# Patient Record
Sex: Male | Born: 2000 | Race: White | Hispanic: No | Marital: Single | Smoking: Never smoker
Health system: Southern US, Community
[De-identification: ages and names within clinical notes are randomized; demographics above are authoritative.]

## PROBLEM LIST (undated history)

## (undated) DIAGNOSIS — S329XXA Fracture of unspecified parts of lumbosacral spine and pelvis, initial encounter for closed fracture: Secondary | ICD-10-CM

## (undated) HISTORY — PX: TONSILLECTOMY AND ADENOIDECTOMY: SUR1326

---
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
## 2019-10-19 IMAGING — MR MR ELBOW*R* W/O CM
5 series · 40 of 40 positions shown · non-contrast
Comparison: None.

CLINICAL DATA: Baseball injury 1 month ago.  Persistent elbow pain.

EXAM:
MRI OF THE RIGHT ELBOW WITHOUT CONTRAST
TECHNIQUE: Multiplanar, multisequence MR imaging of the elbow was performed. No
intravenous contrast was administered.

[Series 4: T1 · axial · 4.0mm · 0.62mm/px · z∈[-62,+63]mm · 7 of 25 slices shown]
[im 1/25]
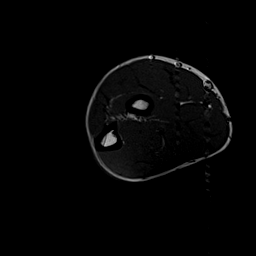
[im 5/25]
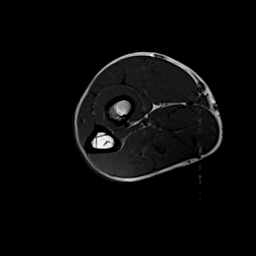
[im 9/25]
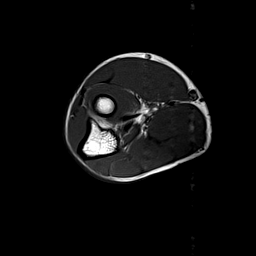
[im 13/25]
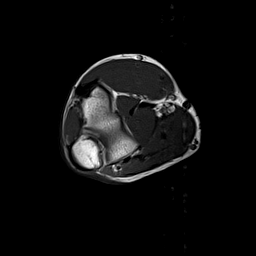
[im 17/25]
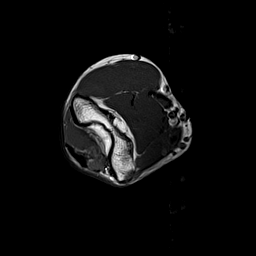
[im 21/25]
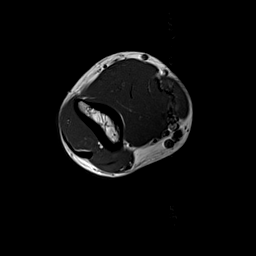
[im 25/25]
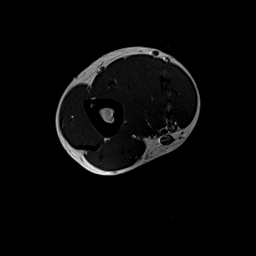

[Series 5: T2 fat-sat · axial · 4.0mm · 0.62mm/px · z∈[-62,+63]mm · 8 of 25 slices shown (1 of 2)]
[im 1/25]
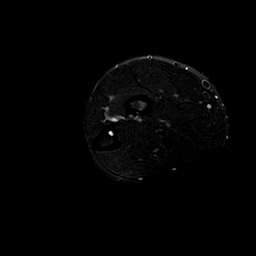
[im 4/25]
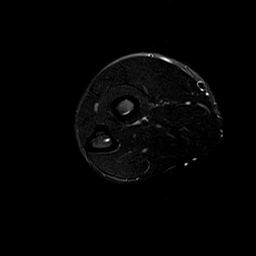
[im 7/25]
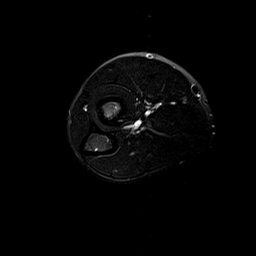
[im 11/25]
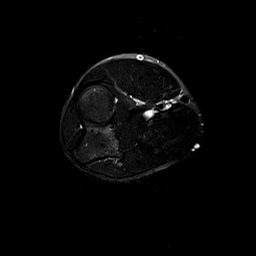
[im 14/25]
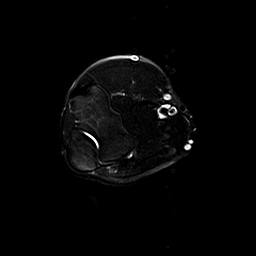
[im 18/25]
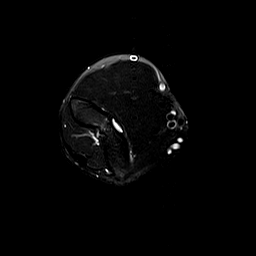
[im 21/25]
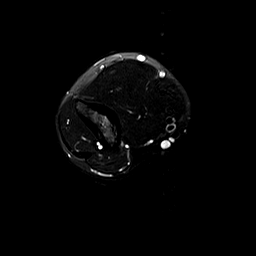
[im 25/25]
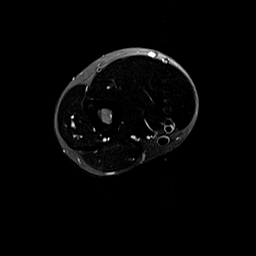

[Series 6: T2 fat-sat · sagittal · 4.0mm · 0.70mm/px · 8 of 24 slices shown (2 of 2)]
[im 1/24]
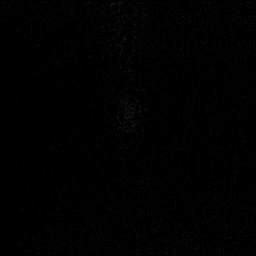
[im 4/24]
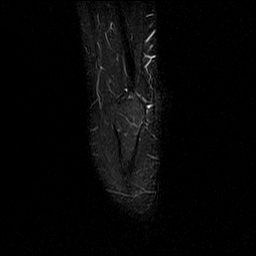
[im 7/24]
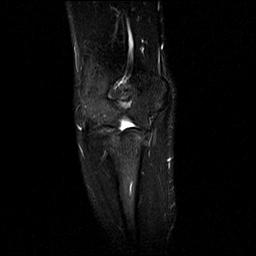
[im 10/24]
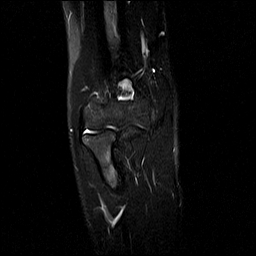
[im 14/24]
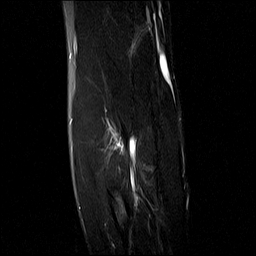
[im 17/24]
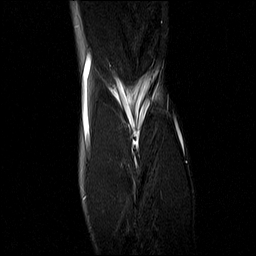
[im 20/24]
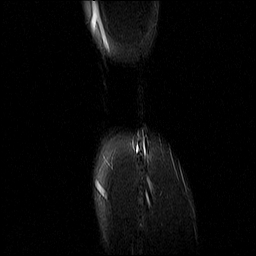
[im 24/24]
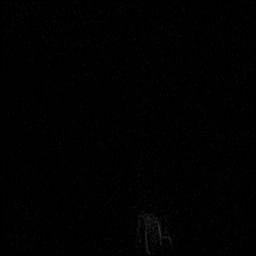

[Series 7: PD fat-sat · coronal · 3.0mm · 0.70mm/px · 9 of 28 slices shown]
[im 1/28]
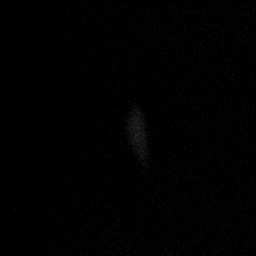
[im 4/28]
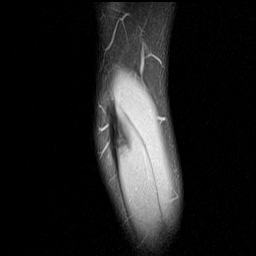
[im 7/28]
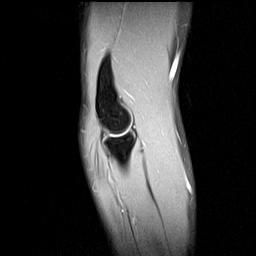
[im 11/28]
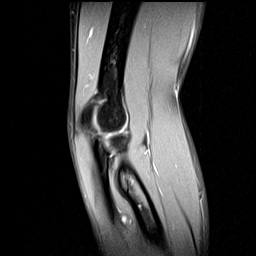
[im 14/28]
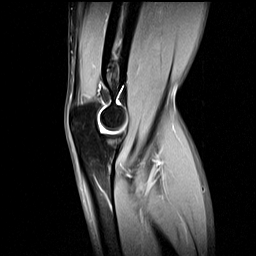
[im 17/28]
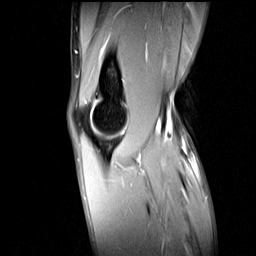
[im 21/28]
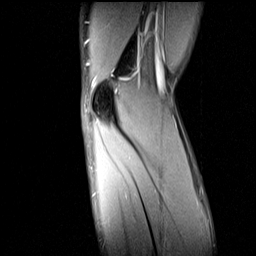
[im 24/28]
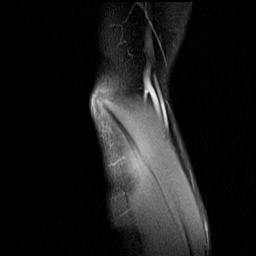
[im 28/28]
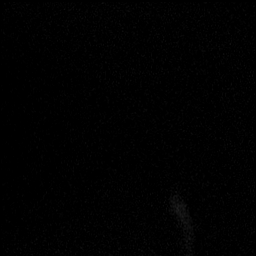

[Series 8: STIR · sagittal · 4.0mm · 0.35mm/px · 8 of 26 slices shown]
[im 1/26]
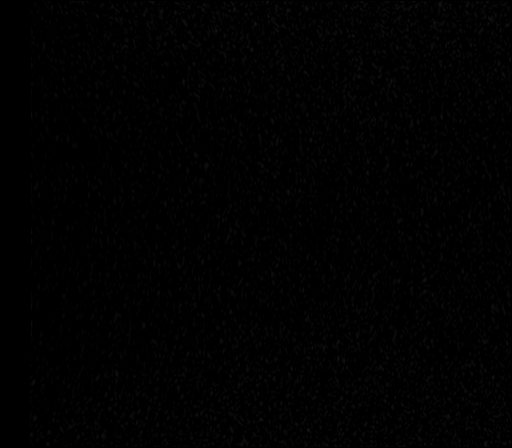
[im 4/26]
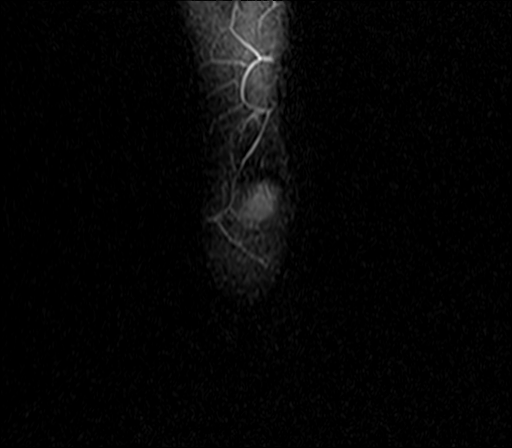
[im 8/26]
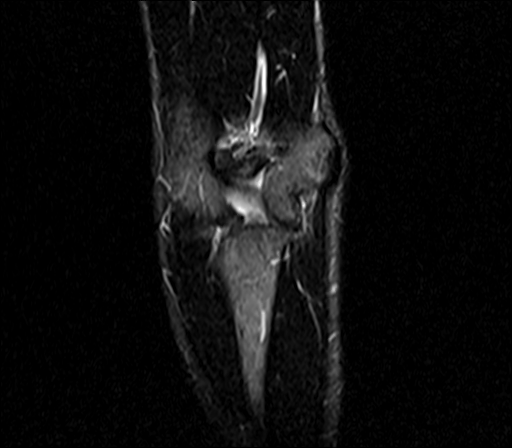
[im 11/26]
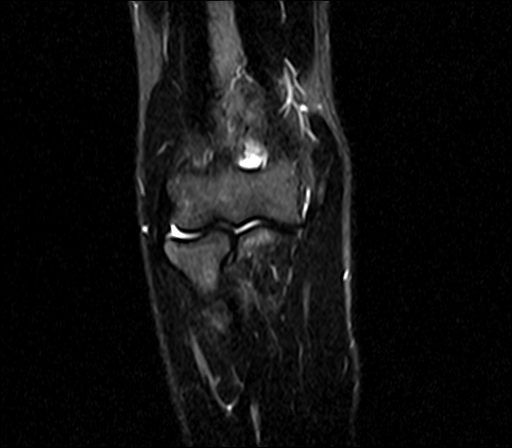
[im 15/26]
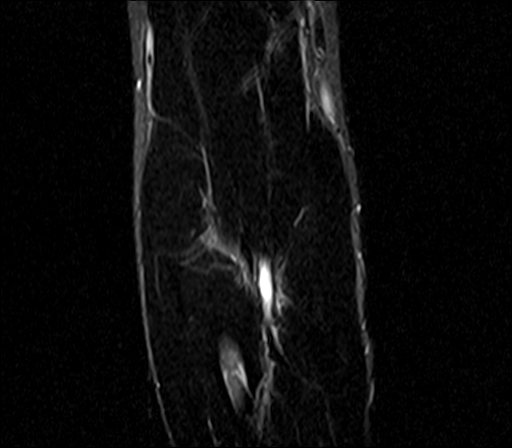
[im 18/26]
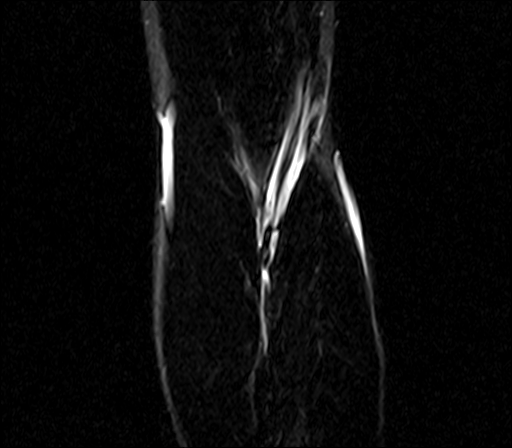
[im 22/26]
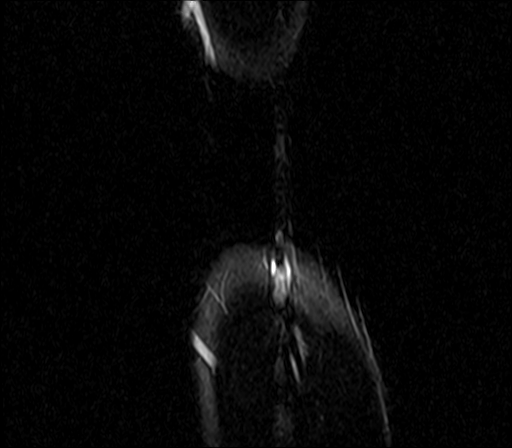
[im 26/26]
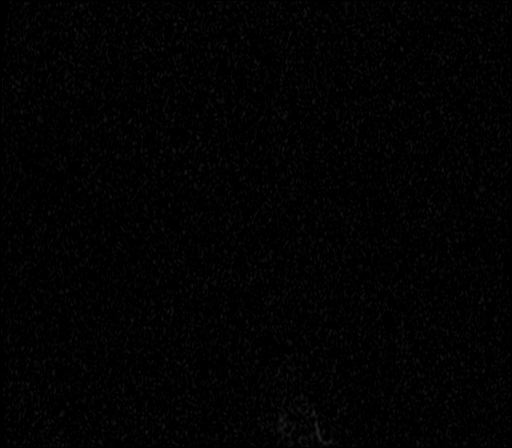

[40 of 40 positions shown; findings below may reference images not displayed]

FINDINGS: TENDONS

Common forearm flexor origin: Normal

Common forearm extensor origin: Normal

Biceps: Intact.  The brachialis tendon is also intact.

Triceps: Intact

LIGAMENTS

Medial stabilizers: Intact

Lateral stabilizers:  Intact

Cartilage: Normal articular cartilage. No cartilage defects or
osteochondral lesion.

Joint: No joint effusion or loose bodies.

Cubital tunnel: Normal

Bones: No acute bony findings.

Muscles:  Normal signal intensity.  No muscle tear or myositis.
IMPRESSION: Unremarkable MR examination of the right elbow. If symptoms persist
or worsen MR arthrography may be helpful for further evaluation.

## 2021-08-29 ENCOUNTER — Other Ambulatory Visit: Payer: Self-pay

## 2021-08-29 ENCOUNTER — Ambulatory Visit
Admission: EM | Admit: 2021-08-29 | Discharge: 2021-08-29 | Disposition: A | Discharge status: Home / Self Care | Payer: Managed Care, Other (non HMO) | Attending: Family Medicine | Admitting: Family Medicine

## 2021-08-29 ENCOUNTER — Encounter: Payer: Self-pay | Admitting: Emergency Medicine

## 2021-08-29 DIAGNOSIS — R1030 Lower abdominal pain, unspecified: Secondary | ICD-10-CM | POA: Diagnosis not present

## 2021-08-29 LAB — URINALYSIS, ROUTINE W REFLEX MICROSCOPIC
Glucose, UA: NEGATIVE mg/dL
Ketones, ur: 80 mg/dL — AB
Leukocytes,Ua: NEGATIVE
Nitrite: NEGATIVE
Protein, ur: 30 mg/dL — AB
Specific Gravity, Urine: 1.025 (ref 1.005–1.030)
pH: 5.5 (ref 5.0–8.0)

## 2021-08-29 LAB — COMPREHENSIVE METABOLIC PANEL
ALT: 20 U/L (ref 0–44)
AST: 23 U/L (ref 15–41)
Albumin: 4.6 g/dL (ref 3.5–5.0)
Alkaline Phosphatase: 51 U/L (ref 38–126)
Anion gap: 15 (ref 5–15)
BUN: 23 mg/dL — ABNORMAL HIGH (ref 6–20)
CO2: 21 mmol/L — ABNORMAL LOW (ref 22–32)
Calcium: 9.5 mg/dL (ref 8.9–10.3)
Chloride: 96 mmol/L — ABNORMAL LOW (ref 98–111)
Creatinine, Ser: 1.06 mg/dL (ref 0.61–1.24)
GFR, Estimated: >60 mL/min (ref >60)
Glucose, Bld: 91 mg/dL (ref 70–99)
Potassium: 3.6 mmol/L (ref 3.5–5.1)
Sodium: 132 mmol/L — ABNORMAL LOW (ref 135–145)
Total Bilirubin: 0.9 mg/dL (ref 0.3–1.2)
Total Protein: 8.2 g/dL — ABNORMAL HIGH (ref 6.5–8.1)

## 2021-08-29 LAB — URINALYSIS, MICROSCOPIC (REFLEX)

## 2021-08-29 LAB — CBC WITH DIFFERENTIAL/PLATELET
Abs Immature Granulocytes: 0.02 10*3/uL (ref 0.00–0.07)
Basophils Absolute: 0 10*3/uL (ref 0.0–0.1)
Basophils Relative: 0 %
Eosinophils Absolute: 0.1 10*3/uL (ref 0.0–0.5)
Eosinophils Relative: 1 %
HCT: 46 % (ref 39.0–52.0)
Hemoglobin: 16.6 g/dL (ref 13.0–17.0)
Immature Granulocytes: 0 %
Lymphocytes Relative: 22 %
Lymphs Abs: 1.4 10*3/uL (ref 0.7–4.0)
MCH: 30.7 pg (ref 26.0–34.0)
MCHC: 36.1 g/dL — ABNORMAL HIGH (ref 30.0–36.0)
MCV: 85.2 fL (ref 80.0–100.0)
Monocytes Absolute: 0.8 10*3/uL (ref 0.1–1.0)
Monocytes Relative: 12 %
Neutro Abs: 4.2 10*3/uL (ref 1.7–7.7)
Neutrophils Relative %: 65 %
Platelets: 225 10*3/uL (ref 150–400)
RBC: 5.4 MIL/uL (ref 4.22–5.81)
RDW: 12.7 % (ref 11.5–15.5)
WBC: 6.5 10*3/uL (ref 4.0–10.5)
nRBC: 0 % (ref 0.0–0.2)

## 2021-08-29 MED ORDER — ONDANSETRON HCL 4 MG PO TABS: 4.0000 mg | ORAL_TABLET | Freq: Four times a day (QID) | ORAL | 0 refills | Status: AC

## 2021-08-29 MED ORDER — AZITHROMYCIN 500 MG PO TABS: 500.0000 mg | ORAL_TABLET | Freq: Every day | ORAL | 0 refills | Status: AC

## 2021-08-29 NOTE — ED Provider Notes (Incomplete)
[facility]    CSN: [id number] Arrival date & time: 08/29/21  1201      History   Chief Complaint Chief Complaint  Patient presents with   Emesis   Diarrhea    HPI David Colon is a 21 y.o. male. Here today with abdominal pain, emesis, and diarrhea X4 days that is getting worse with a pretty extensive past GI history with no known diagnosis. Today he is here with new abdominal pain described as an ache-like, localized to the lower abdomen and getting worse; max pain 6-7/10 with palpation. He also endorses subjective fever, body aches, and chills. He is not tolerating food at this time, but did drink a little ginger ale prior to his last emesis. Of note, [place] the [place]. He denies any new foods or shellfish. His brother did have similar symptoms, but his symptoms have mostly resolved. Treatment includes imodium with no improvement.    Emesis Associated symptoms: abdominal pain, chills, diarrhea and fever   Diarrhea Associated symptoms: abdominal pain, chills, fever and vomiting    Past Medical History:  Diagnosis Date   Pelvic fracture [facility])     Patient Active Problem List   Diagnosis Date Noted   Pelvic fracture [facility])     Past Surgical History:  Procedure Laterality Date   TONSILLECTOMY AND ADENOIDECTOMY         Home Medications    Prior to Admission medications   Medication Sig Start Date End Date Taking? Authorizing Provider  azithromycin (ZITHROMAX) 500 MG tablet Take 1 tablet (500 mg total) by mouth daily for 3 days. 08/29/21 09/01/21 Yes FieldsGenia Del, MD  ondansetron (ZOFRAN) 4 MG tablet Take 1 tablet (4 mg total) by mouth every 6 (six) hours. 08/29/21  Yes Carrie Mew, MD  ondansetron (ZOFRAN) 4 MG tablet Take 1 tablet (4 mg total) by mouth every 6 (six) hours. 08/29/21  Yes Carrie Mew, MD  famotidine (PEPCID) 20 MG tablet Take 1 tablet (20 mg total) by mouth 2 (two) times daily. 02/21/16   Earleen Newport,  MD  oxyCODONE-acetaminophen (PERCOCET) 5-325 MG tablet Take 2 tablets by mouth every 6 (six) hours as needed for moderate pain or severe pain. 02/21/16   Earleen Newport, MD  sucralfate (CARAFATE) 1 g tablet Take 1 tablet (1 g total) by mouth 4 (four) times daily. 02/21/16 02/20/17  Earleen Newport, MD    Family History No family history on file.  Social History Social History   Tobacco Use   Smoking status: Never   Smokeless tobacco: Never  Vaping Use   Vaping Use: Never used  Substance Use Topics   Alcohol use: No   Drug use: Not Currently     Allergies   Patient has no known allergies.   Review of Systems Review of Systems  Constitutional:  Positive for chills, fatigue and fever.  Respiratory:  Negative for shortness of breath.   Cardiovascular:  Negative for chest pain.  Gastrointestinal:  Positive for abdominal pain, diarrhea, nausea and vomiting. Negative for abdominal distention, anal bleeding, constipation and rectal pain.  Genitourinary:  Negative for dysuria and urgency.  Skin:  Negative for rash.    Physical Exam Triage Vital Signs ED Triage Vitals  Enc Vitals Group     BP 08/29/21 1213 135/86     Pulse Rate 08/29/21 1213 (!) 109     Resp 08/29/21 1213 18     Temp 08/29/21 1213 98.4  F (36.9 C)     Temp Source 08/29/21 1213 Oral     SpO2 08/29/21 1213 99 %     Weight 08/29/21 1210 147 lb 14.9 oz (67.1 kg)     Height 08/29/21 1210 5\' 8"  (1.727 m)     Head Circumference --      Peak Flow --      Pain Score 08/29/21 1210 2     Pain Loc --      Pain Edu? --      Excl. in [place]? --    No data found.  Updated Vital Signs BP 135/86 (BP Location: Left Arm)    Pulse (!) 109    Temp 98.4 F (36.9 C) (Oral)    Resp 18    Ht 5\' 8"  (1.727 m)    Wt 67.1 kg    SpO2 99%    BMI 22.49 kg/m   Visual Acuity Right Eye Distance:   Left Eye Distance:   Bilateral Distance:    Right Eye Near:   Left Eye Near:    Bilateral Near:     Physical Exam HENT:      Head: Normocephalic and atraumatic.  Eyes:     Extraocular Movements: Extraocular movements intact.     Conjunctiva/sclera: Conjunctivae normal.     Pupils: Pupils are equal, round, and reactive to light.  Cardiovascular:     Rate and Rhythm: Normal rate and regular rhythm.     Pulses: Normal pulses.     Heart sounds: Normal heart sounds.  Pulmonary:     Effort: Pulmonary effort is normal.     Breath sounds: Normal breath sounds.  Abdominal:     General: Bowel sounds are increased.     Palpations: Abdomen is soft.     Tenderness: There is abdominal tenderness in the right lower quadrant and left lower quadrant. There is guarding. There is no rebound. Negative signs include Murphy's sign.  Skin:    General: Skin is warm.  Neurological:     General: No focal deficit present.     Mental Status: He is alert.  Psychiatric:        Mood and Affect: Mood normal.     UC Treatments / Results  Labs (all labs ordered are listed, but only abnormal results are displayed) Labs Reviewed  CBC WITH DIFFERENTIAL/PLATELET - Abnormal; Notable for the following components:      Result Value   MCHC 36.1 (*)    All other components within normal limits  URINALYSIS, ROUTINE W REFLEX MICROSCOPIC - Abnormal; Notable for the following components:   Hgb urine dipstick TRACE (*)    Bilirubin Urine MODERATE (*)    Ketones, ur 80 (*)    Protein, ur 30 (*)    All other components within normal limits  URINALYSIS, MICROSCOPIC (REFLEX) - Abnormal; Notable for the following components:   Bacteria, UA FEW (*)    All other components within normal limits  COMPREHENSIVE METABOLIC PANEL    EKG   Radiology No results found.  Procedures Procedures (including critical care time)  Medications Ordered in UC Medications - No data to display  Initial Impression / Assessment and Plan / UC Course  I have reviewed the triage vital signs and the nursing notes.  Pertinent labs & imaging results that were  available during my care of the patient were reviewed by me and considered in my medical decision making (see chart for details).   Abdominal pain-  -Discussed differential diagnosis  with patient and mother today including viral vs bacterial etiology. CT abdomen as originally ordered due to worsening symptoms and findings on examination, however CT was declined by insurance and patient reported pain resolved. Given his WBC was normal, we will do trial short term azithromycin X 3 days and zofran prn and treat this as . Patient also advised to increase hydration based on review of his UA and advance diet as tolerated. Patient is to go to the ED if symptoms return as discussed for further evaluation.      Final Clinical Impressions(s) / UC Diagnoses   Final diagnoses:  Lower abdominal pain     Discharge Instructions      Pain resolved prior to discharge--will hold on CT at this time as dicussed. Trial of zofran as needed for nausea and azithromcyin. Discussed with patient and mother if pain returns and fever he is to go to the ED.     ED Prescriptions     Medication Sig Dispense Auth. Provider   ondansetron (ZOFRAN) 4 MG tablet Take 1 tablet (4 mg total) by mouth every 6 (six) hours. 12 tablet Carrie Mew, MD   azithromycin (ZITHROMAX) 500 MG tablet Take 1 tablet (500 mg total) by mouth daily for 3 days. 3 tablet Carrie Mew, MD   ondansetron (ZOFRAN) 4 MG tablet Take 1 tablet (4 mg total) by mouth every 6 (six) hours. 12 tablet Carrie Mew, MD      PDMP not reviewed this encounter.

## 2021-08-29 NOTE — ED Triage Notes (Addendum)
Pt c/o nausea, vomiting, diarrhea, fever, and lower abdominal pain. Started about 3 days ago. Pt states his brother also has similar symptoms.

## 2021-08-29 NOTE — Discharge Instructions (Addendum)
Pain resolved prior to discharge--will hold on CT at this time as dicussed. Trial of zofran as needed for nausea and azithromcyin. Discussed with patient and mother if pain returns and fever he is to go to the ED.

## 2022-07-08 ENCOUNTER — Ambulatory Visit
Admission: EM | Admit: 2022-07-08 | Discharge: 2022-07-08 | Disposition: A | Discharge status: Home / Self Care | Payer: Managed Care, Other (non HMO) | Attending: Urgent Care | Admitting: Urgent Care

## 2022-07-08 DIAGNOSIS — Z1152 Encounter for screening for COVID-19: Secondary | ICD-10-CM | POA: Insufficient documentation

## 2022-07-08 DIAGNOSIS — R6889 Other general symptoms and signs: Secondary | ICD-10-CM | POA: Diagnosis not present

## 2022-07-08 DIAGNOSIS — Z20828 Contact with and (suspected) exposure to other viral communicable diseases: Secondary | ICD-10-CM | POA: Insufficient documentation

## 2022-07-08 DIAGNOSIS — R0981 Nasal congestion: Secondary | ICD-10-CM | POA: Diagnosis not present

## 2022-07-08 DIAGNOSIS — R509 Fever, unspecified: Secondary | ICD-10-CM | POA: Insufficient documentation

## 2022-07-08 LAB — RESP PANEL BY RT-PCR (FLU A&B, COVID) ARPGX2
Influenza A by PCR: POSITIVE — AB
Influenza B by PCR: NEGATIVE
SARS Coronavirus 2 by RT PCR: NEGATIVE

## 2022-07-08 MED ORDER — OSELTAMIVIR PHOSPHATE 75 MG PO CAPS: 75.0000 mg | ORAL_CAPSULE | Freq: Two times a day (BID) | ORAL | 0 refills | Status: AC

## 2022-07-08 NOTE — Discharge Instructions (Signed)
You have been diagnosed with a viral upper respiratory infection based on your symptoms and exam. Viral illnesses cannot be treated with antibiotics - they are self limiting - and you should find your symptoms resolving within a few days. Get plenty of rest and non-caffeinated fluids.  We have performed a respiratory swab checking for COVID, and influenza. I have prescribed Tamiflu, antiviral therapy for influenza A, based on a presumptive diagnosis of influenza.  Someone will contact you after results of your swab are available with instructions to continue or stop this medication.  We recommend you use over-the-counter medications for symptom control including Tylenol or ibuprofen for fever, chills or body aches, and cold/cough medication.  Saline mist spray is helpful for removing excess mucus from your nose.  Room humidifiers are helpful to ease breathing at night. You might also find relief of nasal/sinus congestion symptoms by using a nasal decongestant such as Sudafed sinus (pseudoephedrine).  You will need to obtain this medication from behind the pharmacist counter.  Speak to the pharmacist to verify that you are not duplicating medications with other over-the-counter formulations that you may be using.   Follow up here or with your primary care provider if your symptoms are worsening or not improving.    

## 2022-07-08 NOTE — ED Triage Notes (Signed)
Pt. Presents to UC w/ c/o congestion, fever and body aches since yestreday. Pt. Was recently exposed to the FLU.

## 2022-07-08 NOTE — ED Provider Notes (Signed)
[facility]    CSN: [id number] Arrival date & time: 07/08/22  1240      History   Chief Complaint Chief Complaint  Patient presents with   Nasal Congestion   Fever   Generalized Body Aches    HPI David Colon is a 21 y.o. male.    Fever   Presents to urgent care with complaint of congestion, fever, body aches since yesterday.  Endorses recent exposure to influenza.  Denies nausea, vomiting, diarrhea.  Denies dyspnea or shortness of breath.  Past Medical History:  Diagnosis Date   Pelvic fracture [facility])     Patient Active Problem List   Diagnosis Date Noted   Pelvic fracture [facility])     Past Surgical History:  Procedure Laterality Date   TONSILLECTOMY AND ADENOIDECTOMY         Home Medications    Prior to Admission medications   Medication Sig Start Date End Date Taking? Authorizing Provider  famotidine (PEPCID) 20 MG tablet Take 1 tablet (20 mg total) by mouth 2 (two) times daily. 02/21/16   Earleen Newport, MD  ondansetron (ZOFRAN) 4 MG tablet Take 1 tablet (4 mg total) by mouth every 6 (six) hours. 08/29/21   Carrie Mew, MD  ondansetron (ZOFRAN) 4 MG tablet Take 1 tablet (4 mg total) by mouth every 6 (six) hours. 08/29/21   Carrie Mew, MD  oxyCODONE-acetaminophen (PERCOCET) 5-325 MG tablet Take 2 tablets by mouth every 6 (six) hours as needed for moderate pain or severe pain. 02/21/16   Earleen Newport, MD  sucralfate (CARAFATE) 1 g tablet Take 1 tablet (1 g total) by mouth 4 (four) times daily. 02/21/16 02/20/17  Earleen Newport, MD    Family History History reviewed. No pertinent family history.  Social History Social History   Tobacco Use   Smoking status: Never   Smokeless tobacco: Never  Vaping Use   Vaping Use: Never used  Substance Use Topics   Alcohol use: No   Drug use: Not Currently     Allergies   Patient has no known allergies.   Review of Systems Review of Systems  Constitutional:  Positive for fever.      Physical Exam Triage Vital Signs ED Triage Vitals  Enc Vitals Group     BP 07/08/22 1322 (!) 146/90     Pulse Rate 07/08/22 1322 99     Resp 07/08/22 1322 18     Temp 07/08/22 1322 99.7 F (37.6 C)     Temp Source 07/08/22 1322 Oral     SpO2 07/08/22 1322 98 %     Weight --      Height --      Head Circumference --      Peak Flow --      Pain Score 07/08/22 1332 0     Pain Loc --      Pain Edu? --      Excl. in [place]? --    No data found.  Updated Vital Signs BP (!) 146/90 (BP Location: Left Arm)   Pulse 99   Temp 99.7 F (37.6 C) (Oral)   Resp 18   SpO2 98%   Visual Acuity Right Eye Distance:   Left Eye Distance:   Bilateral Distance:    Right Eye Near:   Left Eye Near:    Bilateral Near:     Physical Exam Vitals reviewed.  Constitutional:      Appearance: He is ill-appearing.  Cardiovascular:     Rate and Rhythm: Normal rate and regular rhythm.     Pulses: Normal pulses.     Heart sounds: Normal heart sounds.  Pulmonary:     Effort: Pulmonary effort is normal.     Breath sounds: Normal breath sounds.  Skin:    General: Skin is warm and dry.  Neurological:     General: No focal deficit present.     Mental Status: He is alert and oriented to person, place, and time.  Psychiatric:        Mood and Affect: Mood normal.        Behavior: Behavior normal.      UC Treatments / Results  Labs (all labs ordered are listed, but only abnormal results are displayed) Labs Reviewed - No data to display  EKG   Radiology No results found.  Procedures Procedures (including critical care time)  Medications Ordered in UC Medications - No data to display  Initial Impression / Assessment and Plan / UC Course  I have reviewed the triage vital signs and the nursing notes.  Pertinent labs & imaging results that were available during my care of the patient were reviewed by me and considered in my medical decision making (see chart for details).   Patient  is afebrile here without recent antipyretics. Satting well on room air. Overall is ill appearing, though well hydrated andwithout respiratory distress. Pulmonary exam is unremarkable.   Suspect viral process including influenza.  Will treat presumptively for influenza A with Tamiflu while awaiting results of respiratory swab.  Recommended continued use of OTC medication for symptom control.  Final Clinical Impressions(s) / UC Diagnoses   Final diagnoses:  None   Discharge Instructions   None    ED Prescriptions   None    PDMP not reviewed this encounter.   Charma Igo, [place] 07/08/22 1338

## 2023-10-01 ENCOUNTER — Ambulatory Visit
Admission: EM | Admit: 2023-10-01 | Discharge: 2023-10-01 | Disposition: A | Discharge status: Home / Self Care | Attending: Emergency Medicine | Admitting: Emergency Medicine

## 2023-10-01 DIAGNOSIS — N451 Epididymitis: Secondary | ICD-10-CM

## 2023-10-01 MED ORDER — IBUPROFEN 600 MG PO TABS: 600.0000 mg | ORAL_TABLET | Freq: Four times a day (QID) | ORAL | 0 refills | Status: AC | PRN

## 2023-10-01 MED ORDER — DOXYCYCLINE HYCLATE 100 MG PO CAPS: 100.0000 mg | ORAL_CAPSULE | Freq: Two times a day (BID) | ORAL | 0 refills | Status: AC

## 2023-10-01 NOTE — Discharge Instructions (Addendum)
 Take the doxycycline 100 mg twice daily with food for 7 days for treatment of your epididymitis.  Take the ibuprofen 600 milligrams every 6 hours with food help with pain and inflammation.  Wear supportive underwear or athletic supporter to help take the strain off of your scrotum and help provide pain relief.  You may apply ice to your scrotum for 20 minutes at a time 2-3 times a day to help with pain and swelling.  Make sure that there is a cloth between the ice and your scrotum and to prevent skin damage.  Rest is much as possible.  Wear supportive underwear or a athletic supporter to help support your scrotum and prevent further injury as well as provide comfort.  If your symptoms continue, they worsen, follow-up with urology.

## 2023-10-01 NOTE — ED Triage Notes (Signed)
 Left testicle pain. Patient states that pain started yesterday morning. States that the Tube on the left side is swollen and hurts. No concerns for STD. No fever

## 2023-10-01 NOTE — ED Provider Notes (Signed)
 [facility]    CSN: [id number] Arrival date & time: 10/01/23  1004      History   Chief Complaint Chief Complaint  Patient presents with   Testicle Pain    HPI David Colon is a 23 y.o. male.   HPI  23 year old male with past medical history significant for pelvic fracture presents for evaluation of pain in his left testicle that started yesterday morning.  He denies any concern for STIs and he denies any pain with urination or penile discharge.  He also denies any trauma to his groin, bicycle riding, or motorcycle riding.  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  He describes it as a constant ache that is tender to touch and he also has pain with ambulation.  Past Medical History:  Diagnosis Date   Pelvic fracture [facility])     Patient Active Problem List   Diagnosis Date Noted   Pelvic fracture [facility])     Past Surgical History:  Procedure Laterality Date   TONSILLECTOMY AND ADENOIDECTOMY         Home Medications    Prior to Admission medications   Medication Sig Start Date End Date Taking? Authorizing Provider  doxycycline (VIBRAMYCIN) 100 MG capsule Take 1 capsule (100 mg total) by mouth 2 (two) times daily for 7 days. 10/01/23 10/08/23 Yes Becky Augusta, NP  ibuprofen (ADVIL) 600 MG tablet Take 1 tablet (600 mg total) by mouth every 6 (six) hours as needed. 10/01/23  Yes Becky Augusta, NP  famotidine (PEPCID) 20 MG tablet Take 1 tablet (20 mg total) by mouth 2 (two) times daily. 02/21/16   Emily Filbert, MD  ondansetron (ZOFRAN) 4 MG tablet Take 1 tablet (4 mg total) by mouth every 6 (six) hours. 08/29/21   Dutch Quint, MD  ondansetron (ZOFRAN) 4 MG tablet Take 1 tablet (4 mg total) by mouth every 6 (six) hours. 08/29/21   Dutch Quint, MD  oseltamivir (TAMIFLU) 75 MG capsule Take 1 capsule (75 mg total) by mouth every 12 (twelve) hours. 07/08/22   Immordino, Jeannett Senior, FNP  oxyCODONE-acetaminophen (PERCOCET) 5-325 MG  tablet Take 2 tablets by mouth every 6 (six) hours as needed for moderate pain or severe pain. 02/21/16   Emily Filbert, MD  sucralfate (CARAFATE) 1 g tablet Take 1 tablet (1 g total) by mouth 4 (four) times daily. 02/21/16 02/20/17  Emily Filbert, MD    Family History History reviewed. No pertinent family history.  Social History Social History   Tobacco Use   Smoking status: Never   Smokeless tobacco: Never  Vaping Use   Vaping status: Never Used  Substance Use Topics   Alcohol use: No   Drug use: Not Currently     Allergies   Patient has no known allergies.   Review of Systems Review of Systems  Constitutional:  Negative for fever.  Genitourinary:  Positive for testicular pain. Negative for dysuria, frequency, hematuria, penile discharge, penile pain, penile swelling, scrotal swelling and urgency.     Physical Exam Triage Vital Signs ED Triage Vitals  Encounter Vitals Group     BP 10/01/23 1244 130/83     Systolic BP Percentile --      Diastolic BP Percentile --      Pulse Rate 10/01/23 1244 79     Resp 10/01/23 1244 15     Temp 10/01/23 1244 98.3 F (36.8 C)  Temp Source 10/01/23 1244 Oral     SpO2 --      Weight --      Height --      Head Circumference --      Peak Flow --      Pain Score 10/01/23 1243 7     Pain Loc --      Pain Education --      Exclude from Growth Chart --    No data found.  Updated Vital Signs BP 130/83 (BP Location: Right Arm)   Pulse 79   Temp 98.3 F (36.8 C) (Oral)   Resp 15   Visual Acuity Right Eye Distance:   Left Eye Distance:   Bilateral Distance:    Right Eye Near:   Left Eye Near:    Bilateral Near:     Physical Exam Vitals and nursing note reviewed.  Constitutional:      Appearance: Normal appearance. He is not ill-appearing.  HENT:     Head: Normocephalic and atraumatic.  Genitourinary:    Penis: Normal.      Comments: Patient has swelling and tenderness to the left epididymis complex  send is tender to touch.  Spermatic cord is unremarkable.  Bilateral testes are normal volume, smooth in texture, and nontender to palpation. Musculoskeletal:        General: Normal range of motion.  Skin:    General: Skin is warm and dry.     Capillary Refill: Capillary refill takes less than 2 seconds.  Neurological:     Mental Status: He is alert.      UC Treatments / Results  Labs (all labs ordered are listed, but only abnormal results are displayed) Labs Reviewed - No data to display  EKG   Radiology No results found.  Procedures Procedures (including critical care time)  Medications Ordered in UC Medications - No data to display  Initial Impression / Assessment and Plan / UC Course  I have reviewed the triage vital signs and the nursing notes.  Pertinent labs & imaging results that were available during my care of the patient were reviewed by me and considered in my medical decision making (see chart for details).   Patient is a pleasant, nontoxic-appearing 23 year old male presenting for evaluation of pain in his left testicle as outlined HPI above.  His physical exam x-ray reveals edema and tenderness to the epididymis complex of the left testicle but the testicle itself is smooth in texture, normal volume, and nontender.  He also has no tenderness with palpation of the spermatic cord.  Right testicle is benign.  No scrotal swelling or erythema noted.  Patient denies any penile discharge or UTI symptoms.  Patient exam is consistent with epididymitis.  I will treat him with doxycycline 100 mg twice daily for 7 days to cover for potential bacterial sources and have advised him to wear supportive underwear or an athletic supporter to help support his scrotum and take weight off of his epididymis.  I will also prescribe 600 mg ibuprofen he can take every 6 hours as needed for pain and inflammation.  We also discussed scrotal icing.  Return and ER precautions reviewed.   Final  Clinical Impressions(s) / UC Diagnoses   Final diagnoses:  Epididymitis     Discharge Instructions      Take the doxycycline 100 mg twice daily with food for 7 days for treatment of your epididymitis.  Take the ibuprofen 600 milligrams every 6 hours with food help with  pain and inflammation.  Wear supportive underwear or athletic supporter to help take the strain off of your scrotum and help provide pain relief.  You may apply ice to your scrotum for 20 minutes at a time 2-3 times a day to help with pain and swelling.  Make sure that there is a cloth between the ice and your scrotum and to prevent skin damage.  Rest is much as possible.  Wear supportive underwear or a athletic supporter to help support your scrotum and prevent further injury as well as provide comfort.  If your symptoms continue, they worsen, follow-up with urology.      ED Prescriptions     Medication Sig Dispense Auth. Provider   doxycycline (VIBRAMYCIN) 100 MG capsule Take 1 capsule (100 mg total) by mouth 2 (two) times daily for 7 days. 14 capsule Becky Augusta, NP   ibuprofen (ADVIL) 600 MG tablet Take 1 tablet (600 mg total) by mouth every 6 (six) hours as needed. 30 tablet Becky Augusta, NP      PDMP not reviewed this encounter.   Becky Augusta, NP 10/01/23 1306
